# Patient Record
Sex: Female | Born: 1985 | Race: White | Hispanic: No | Marital: Married | Smoking: Never smoker
Health system: Southern US, Community
[De-identification: ages and names within clinical notes are randomized; demographics above are authoritative.]

## PROBLEM LIST (undated history)

## (undated) DIAGNOSIS — F329 Major depressive disorder, single episode, unspecified: Secondary | ICD-10-CM

## (undated) DIAGNOSIS — F419 Anxiety disorder, unspecified: Secondary | ICD-10-CM

## (undated) DIAGNOSIS — M797 Fibromyalgia: Secondary | ICD-10-CM

## (undated) DIAGNOSIS — F32A Depression, unspecified: Secondary | ICD-10-CM

## (undated) HISTORY — PX: APPENDECTOMY: SHX54

---

## 2011-01-19 ENCOUNTER — Ambulatory Visit: Payer: Self-pay | Admitting: Internal Medicine

## 2011-07-04 ENCOUNTER — Ambulatory Visit: Payer: Self-pay | Admitting: Internal Medicine

## 2011-07-18 ENCOUNTER — Ambulatory Visit: Payer: Self-pay

## 2012-12-04 ENCOUNTER — Ambulatory Visit: Payer: Self-pay | Admitting: Emergency Medicine

## 2014-01-04 ENCOUNTER — Ambulatory Visit: Payer: Self-pay | Admitting: Internal Medicine

## 2016-04-06 ENCOUNTER — Ambulatory Visit (INDEPENDENT_AMBULATORY_CARE_PROVIDER_SITE_OTHER): Payer: BLUE CROSS/BLUE SHIELD

## 2016-04-06 ENCOUNTER — Ambulatory Visit
Admission: EM | Admit: 2016-04-06 | Discharge: 2016-04-06 | Disposition: A | Discharge status: Home / Self Care | Payer: BLUE CROSS/BLUE SHIELD | Attending: Family Medicine | Admitting: Family Medicine

## 2016-04-06 ENCOUNTER — Encounter: Payer: Self-pay | Admitting: Gynecology

## 2016-04-06 DIAGNOSIS — S93401A Sprain of unspecified ligament of right ankle, initial encounter: Secondary | ICD-10-CM

## 2016-04-06 HISTORY — DX: Major depressive disorder, single episode, unspecified: F32.9

## 2016-04-06 HISTORY — DX: Fibromyalgia: M79.7

## 2016-04-06 HISTORY — DX: Depression, unspecified: F32.A

## 2016-04-06 HISTORY — DX: Anxiety disorder, unspecified: F41.9

## 2016-04-06 MED ORDER — OXYCODONE-ACETAMINOPHEN 5-325 MG PO TABS: 1.0000 | ORAL_TABLET | Freq: Four times a day (QID) | ORAL | 0 refills | Status: AC | PRN

## 2016-04-06 NOTE — Discharge Instructions (Signed)
Encouraged follow-up with podiatry or orthopedic foot/ankle. Rest, ice, elevation, compression. Walking boot and crutches initially. NSAIDs when necessary.

## 2016-04-06 NOTE — ED Triage Notes (Signed)
Per patient fell on right ankle side way while jumping on a trampoline

## 2016-04-06 NOTE — ED Provider Notes (Signed)
[facility]    CSN: [id number] Arrival date & time: 04/06/16  [id number]  First Provider Contact:  None       History   Chief Complaint Chief Complaint  Patient presents with  . Ankle Pain    HPI Delila PereyraDevon R Tennell is a 30 y.o. female.   HPI: Patient presents today with right ankle swelling and pain. Patient states that she was injured on a trampoline yesterday. She believes she had an inversion injury. She denies any other injury anywhere else. She has injured this ankle in the past. She has used ice on the area.  Past Medical History:  Diagnosis Date  . Anxiety   . Depression   . Fibromyalgia     There are no active problems to display for this patient.   Past Surgical History:  Procedure Laterality Date  . APPENDECTOMY      OB History    Gravida Para Term Preterm AB Living   1             SAB TAB Ectopic Multiple Live Births                   Home Medications    Prior to Admission medications   Medication Sig Start Date End Date Taking? Authorizing Provider  buPROPion (WELLBUTRIN XL) 300 MG 24 hr tablet Take 300 mg by mouth daily.   Yes Historical Provider, MD    Family History No family history on file.  Social History Social History  Substance Use Topics  . Smoking status: Never Smoker  . Smokeless tobacco: Never Used  . Alcohol use No     Allergies   Review of patient's allergies indicates no known allergies.   Review of Systems Review of Systems: Negative except mentioned above.   Physical Exam Triage Vital Signs ED Triage Vitals  Enc Vitals Group     BP 04/06/16 0954 122/81     Pulse Rate 04/06/16 0954 (!) 105     Resp 04/06/16 0954 18     Temp 04/06/16 0954 98.2 F (36.8 C)     Temp Source 04/06/16 0954 Oral     SpO2 04/06/16 0954 100 %     Weight 04/06/16 0956 184 lb (83.5 kg)     Height 04/06/16 0956 5\' 2"  (1.575 m)     Head Circumference --      Peak Flow --      Pain Score 04/06/16 0959 5     Pain Loc --    Pain Edu? --      Excl. in GC? --    No data found.   Updated Vital Signs BP 122/81 (BP Location: Right Arm)   Pulse (!) 105   Temp 98.2 F (36.8 C) (Oral)   Resp 18   Ht 5\' 2"  (1.575 m)   Wt 184 lb (83.5 kg)   LMP 03/18/2016   SpO2 100%   Breastfeeding? No   BMI 33.65 kg/m      Physical Exam:  GENERAL: NAD RESP: CTA B CARD: RRR MSK: R Foot/Ankle- moderate swelling of ankle laterally, mild ecchymosis laterally, mild swelling of dorsal aspect of foot, ROM limited in all directions including dorsiflexion and plantarflexion, Achilles intact, unable to assess stability given cooperation, nv intact  NEURO: CN II-XII grossly intact    UC Treatments / Results  Labs (all labs ordered are listed, but only abnormal results are displayed) Labs Reviewed - No data to display  EKG  EKG  Interpretation None       Radiology No results found.  Procedures Procedures (including critical care time)  Medications Ordered in UC Medications - No data to display   Initial Impression / Assessment and Plan / UC Course  I have reviewed the triage vital signs and the nursing notes.  Pertinent labs & imaging results that were available during my care of the patient were reviewed by me and considered in my medical decision making (see chart for details).  Clinical Course   A/P: Right ankle/foot injury - reviewed x-ray results with patient, I would recommend that patient follow-up with podiatry/orthopedic ankle/foot on Monday. Her current lack of range of motion could be due to the swelling and pain that she has however I would like this to be checked out further on Monday. I've asked that she rest, ice, compress and elevate the ankle/foot. Patient already had a walking boot from her previous ankle injury and crutches which she will use for now. If any acute worsening symptoms over the weekend she will go to the ER as discussed. NSAIDs when necessary, Percocet for increased pain.  Final  Clinical Impressions(s) / UC Diagnoses   Final diagnoses:  None    New Prescriptions New Prescriptions   No medications on file     Jolene ProvostKirtida Brennen Gardiner, MD 04/06/16 1152

## 2017-12-17 IMAGING — CR DG FOOT COMPLETE 3+V*R*
3 series · 3 of 3 positions shown · non-contrast
Comparison: 04/06/2016

CLINICAL DATA: Injury, fall, lateral pain and swelling

EXAM:
RIGHT FOOT COMPLETE - 3+ VIEW

[foot ap]
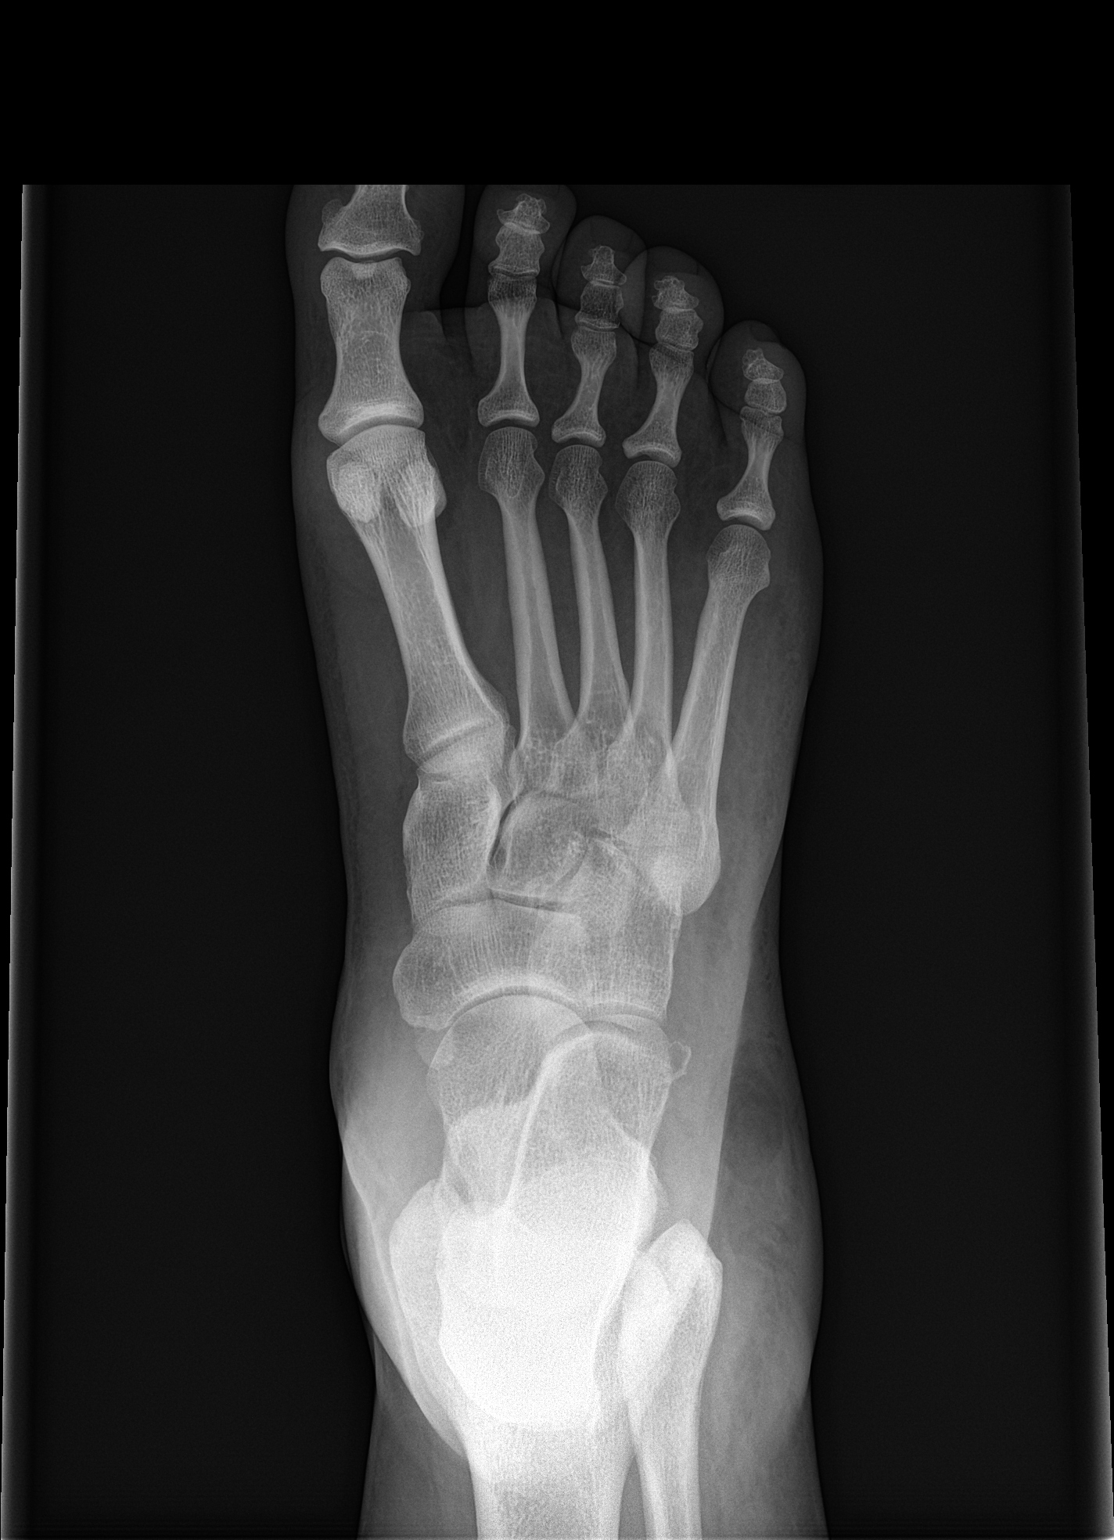

[foot obl]
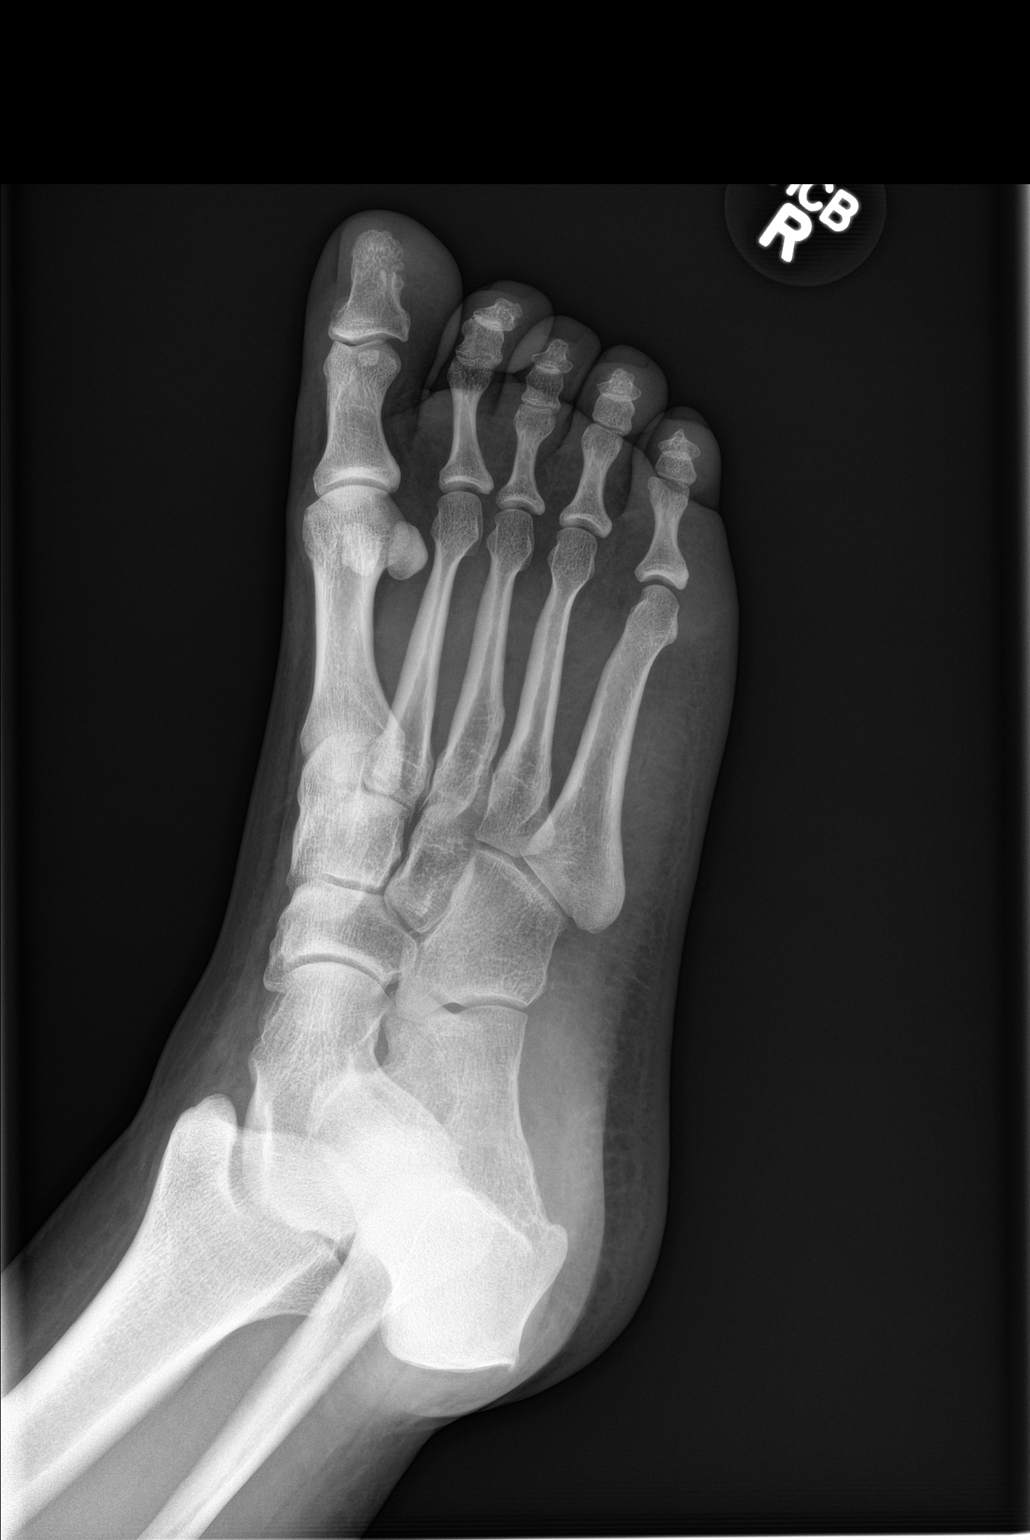

[foot lat]
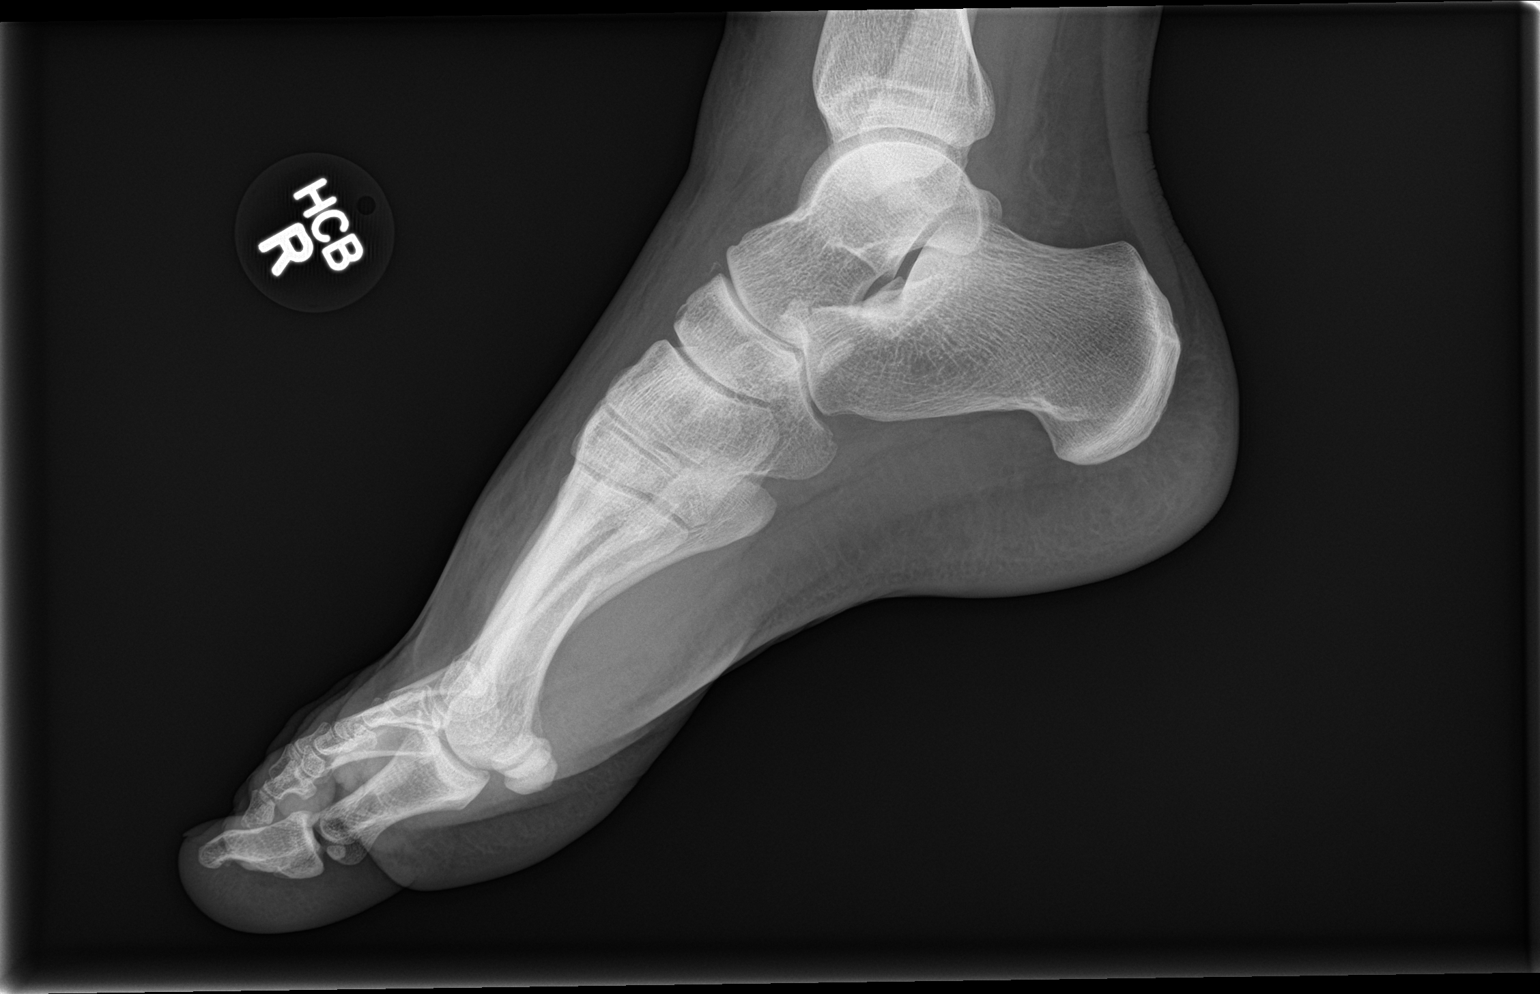

[3 of 3 positions shown; findings below may reference images not displayed]

FINDINGS: Lateral ankle and foot soft tissue swelling noted. Small avulsion
fractures noted along the lateral calcaneus margin, anterior process
of the calcaneus, and dorsally along the talonavicular joint. No
malalignment, subluxation or dislocation. No joint abnormality.
Other tarsal bones and metatarsals appear intact.
IMPRESSION: Small avulsion fractures of the lateral calcaneal margin, anterior
process of the calcaneus and dorsally along the talonavicular joint.

Diffuse lateral foot and ankle soft tissue swelling

## 2018-09-15 ENCOUNTER — Ambulatory Visit
Admission: EM | Admit: 2018-09-15 | Discharge: 2018-09-15 | Discharge status: Absent without leave | Payer: BLUE CROSS/BLUE SHIELD
